# Patient Record
Sex: Female | Born: 1980 | Race: White | Hispanic: No | Marital: Single | Smoking: Current every day smoker
Health system: Southern US, Community
[De-identification: ages and names within clinical notes are randomized; demographics above are authoritative.]

## PROBLEM LIST (undated history)

## (undated) DIAGNOSIS — I4891 Unspecified atrial fibrillation: Secondary | ICD-10-CM

## (undated) DIAGNOSIS — R011 Cardiac murmur, unspecified: Secondary | ICD-10-CM

## (undated) HISTORY — PX: TONSILLECTOMY: SUR1361

---

## 2014-08-22 ENCOUNTER — Emergency Department (HOSPITAL_COMMUNITY)
Admission: EM | Admit: 2014-08-22 | Discharge: 2014-08-23 | Disposition: A | Discharge status: Home / Self Care | Payer: Self-pay | Attending: Emergency Medicine | Admitting: Emergency Medicine

## 2014-08-22 ENCOUNTER — Emergency Department (HOSPITAL_COMMUNITY): Payer: Self-pay

## 2014-08-22 ENCOUNTER — Encounter (HOSPITAL_COMMUNITY): Payer: Self-pay | Admitting: Emergency Medicine

## 2014-08-22 DIAGNOSIS — F1111 Opioid abuse, in remission: Secondary | ICD-10-CM | POA: Diagnosis present

## 2014-08-22 DIAGNOSIS — F411 Generalized anxiety disorder: Secondary | ICD-10-CM | POA: Insufficient documentation

## 2014-08-22 DIAGNOSIS — F419 Anxiety disorder, unspecified: Secondary | ICD-10-CM | POA: Diagnosis present

## 2014-08-22 DIAGNOSIS — Z008 Encounter for other general examination: Secondary | ICD-10-CM | POA: Insufficient documentation

## 2014-08-22 DIAGNOSIS — R079 Chest pain, unspecified: Secondary | ICD-10-CM | POA: Insufficient documentation

## 2014-08-22 DIAGNOSIS — F1320 Sedative, hypnotic or anxiolytic dependence, uncomplicated: Secondary | ICD-10-CM

## 2014-08-22 DIAGNOSIS — F13230 Sedative, hypnotic or anxiolytic dependence with withdrawal, uncomplicated: Secondary | ICD-10-CM | POA: Diagnosis present

## 2014-08-22 DIAGNOSIS — F13930 Sedative, hypnotic or anxiolytic use, unspecified with withdrawal, uncomplicated: Secondary | ICD-10-CM | POA: Diagnosis present

## 2014-08-22 DIAGNOSIS — R002 Palpitations: Secondary | ICD-10-CM | POA: Insufficient documentation

## 2014-08-22 DIAGNOSIS — F4310 Post-traumatic stress disorder, unspecified: Secondary | ICD-10-CM | POA: Insufficient documentation

## 2014-08-22 DIAGNOSIS — F17200 Nicotine dependence, unspecified, uncomplicated: Secondary | ICD-10-CM | POA: Insufficient documentation

## 2014-08-22 DIAGNOSIS — F329 Major depressive disorder, single episode, unspecified: Secondary | ICD-10-CM | POA: Insufficient documentation

## 2014-08-22 DIAGNOSIS — Z91040 Latex allergy status: Secondary | ICD-10-CM | POA: Insufficient documentation

## 2014-08-22 DIAGNOSIS — F1120 Opioid dependence, uncomplicated: Secondary | ICD-10-CM | POA: Diagnosis present

## 2014-08-22 DIAGNOSIS — R45851 Suicidal ideations: Secondary | ICD-10-CM

## 2014-08-22 DIAGNOSIS — Z3202 Encounter for pregnancy test, result negative: Secondary | ICD-10-CM | POA: Insufficient documentation

## 2014-08-22 DIAGNOSIS — M7989 Other specified soft tissue disorders: Secondary | ICD-10-CM | POA: Insufficient documentation

## 2014-08-22 DIAGNOSIS — Z8679 Personal history of other diseases of the circulatory system: Secondary | ICD-10-CM | POA: Insufficient documentation

## 2014-08-22 DIAGNOSIS — R110 Nausea: Secondary | ICD-10-CM | POA: Insufficient documentation

## 2014-08-22 DIAGNOSIS — R011 Cardiac murmur, unspecified: Secondary | ICD-10-CM | POA: Insufficient documentation

## 2014-08-22 DIAGNOSIS — F1994 Other psychoactive substance use, unspecified with psychoactive substance-induced mood disorder: Secondary | ICD-10-CM | POA: Diagnosis present

## 2014-08-22 DIAGNOSIS — F19939 Other psychoactive substance use, unspecified with withdrawal, unspecified: Secondary | ICD-10-CM | POA: Insufficient documentation

## 2014-08-22 HISTORY — DX: Cardiac murmur, unspecified: R01.1

## 2014-08-22 HISTORY — DX: Unspecified atrial fibrillation: I48.91

## 2014-08-22 LAB — RAPID URINE DRUG SCREEN, HOSP PERFORMED
Amphetamines: NOT DETECTED
BENZODIAZEPINES: POSITIVE — AB
Barbiturates: NOT DETECTED
Cocaine: NOT DETECTED
Opiates: NOT DETECTED
Tetrahydrocannabinol: NOT DETECTED

## 2014-08-22 LAB — CBC
HEMATOCRIT: 39.3 % (ref 36.0–46.0)
Hemoglobin: 13.2 g/dL (ref 12.0–15.0)
MCH: 29.5 pg (ref 26.0–34.0)
MCHC: 33.6 g/dL (ref 30.0–36.0)
MCV: 87.7 fL (ref 78.0–100.0)
PLATELETS: 240 10*3/uL (ref 150–400)
RBC: 4.48 MIL/uL (ref 3.87–5.11)
RDW: 13.3 % (ref 11.5–15.5)
WBC: 8.5 10*3/uL (ref 4.0–10.5)

## 2014-08-22 LAB — URINALYSIS, ROUTINE W REFLEX MICROSCOPIC
Bilirubin Urine: NEGATIVE
GLUCOSE, UA: NEGATIVE mg/dL
Hgb urine dipstick: NEGATIVE
KETONES UR: NEGATIVE mg/dL
LEUKOCYTES UA: NEGATIVE
Nitrite: NEGATIVE
PH: 8 (ref 5.0–8.0)
Protein, ur: NEGATIVE mg/dL
Specific Gravity, Urine: 1.014 (ref 1.005–1.030)
Urobilinogen, UA: 0.2 mg/dL (ref 0.0–1.0)

## 2014-08-22 LAB — ACETAMINOPHEN LEVEL: Acetaminophen (Tylenol), Serum: <15 ug/mL (ref 10–30)

## 2014-08-22 LAB — COMPREHENSIVE METABOLIC PANEL
ALBUMIN: 4 g/dL (ref 3.5–5.2)
ALT: 10 U/L (ref 0–35)
AST: 13 U/L (ref 0–37)
Alkaline Phosphatase: 63 U/L (ref 39–117)
Anion gap: 13 (ref 5–15)
BUN: 8 mg/dL (ref 6–23)
CALCIUM: 9.7 mg/dL (ref 8.4–10.5)
CO2: 23 mEq/L (ref 19–32)
CREATININE: 0.84 mg/dL (ref 0.50–1.10)
Chloride: 103 mEq/L (ref 96–112)
GFR calc Af Amer: >90 mL/min (ref >90)
GFR calc non Af Amer: >90 mL/min (ref >90)
Glucose, Bld: 85 mg/dL (ref 70–99)
Potassium: 4.6 mEq/L (ref 3.7–5.3)
Sodium: 139 mEq/L (ref 137–147)
Total Bilirubin: 0.2 mg/dL — ABNORMAL LOW (ref 0.3–1.2)
Total Protein: 7.7 g/dL (ref 6.0–8.3)

## 2014-08-22 LAB — D-DIMER, QUANTITATIVE (NOT AT ARMC)

## 2014-08-22 LAB — PREGNANCY, URINE: PREG TEST UR: NEGATIVE

## 2014-08-22 LAB — ETHANOL

## 2014-08-22 LAB — SALICYLATE LEVEL

## 2014-08-22 MED ORDER — ONDANSETRON HCL 4 MG PO TABS: 4.0000 mg | ORAL_TABLET | Freq: Three times a day (TID) | ORAL | Status: DC | PRN

## 2014-08-22 MED ORDER — IBUPROFEN 200 MG PO TABS
600.0000 mg | ORAL_TABLET | Freq: Three times a day (TID) | ORAL | Status: DC | PRN
  Administered 2014-08-22: 600 mg via ORAL
  Filled 2014-08-22: qty 3

## 2014-08-22 MED ORDER — MISOPROSTOL 100 MCG PO TABS
100.0000 ug | ORAL_TABLET | Freq: Three times a day (TID) | ORAL | Status: DC
  Filled 2014-08-22 (×4): qty 1

## 2014-08-22 MED ORDER — QUETIAPINE FUMARATE 100 MG PO TABS
150.0000 mg | ORAL_TABLET | Freq: Every day | ORAL | Status: DC
  Administered 2014-08-22: 150 mg via ORAL
  Filled 2014-08-22 (×2): qty 1

## 2014-08-22 MED ORDER — LORAZEPAM 1 MG PO TABS
1.0000 mg | ORAL_TABLET | Freq: Once | ORAL | Status: AC
  Administered 2014-08-22: 1 mg via ORAL
  Filled 2014-08-22: qty 1

## 2014-08-22 MED ORDER — ONDANSETRON 4 MG PO TBDP
4.0000 mg | ORAL_TABLET | Freq: Once | ORAL | Status: DC
  Filled 2014-08-22: qty 1

## 2014-08-22 MED ORDER — NICOTINE 21 MG/24HR TD PT24
21.0000 mg | MEDICATED_PATCH | Freq: Every day | TRANSDERMAL | Status: DC
  Administered 2014-08-22 – 2014-08-23 (×2): 21 mg via TRANSDERMAL
  Filled 2014-08-22 (×2): qty 1

## 2014-08-22 MED ORDER — ZOLPIDEM TARTRATE 5 MG PO TABS
5.0000 mg | ORAL_TABLET | Freq: Every evening | ORAL | Status: DC | PRN
  Filled 2014-08-22: qty 1

## 2014-08-22 MED ORDER — PANTOPRAZOLE SODIUM 40 MG PO TBEC
40.0000 mg | DELAYED_RELEASE_TABLET | Freq: Two times a day (BID) | ORAL | Status: DC
  Administered 2014-08-22 – 2014-08-23 (×2): 40 mg via ORAL
  Filled 2014-08-22 (×2): qty 1

## 2014-08-22 MED ORDER — QUETIAPINE FUMARATE 50 MG PO TABS
50.0000 mg | ORAL_TABLET | Freq: Three times a day (TID) | ORAL | Status: DC
  Administered 2014-08-22 – 2014-08-23 (×2): 50 mg via ORAL
  Filled 2014-08-22: qty 2
  Filled 2014-08-22: qty 1

## 2014-08-22 MED ORDER — LORAZEPAM 1 MG PO TABS: 1.0000 mg | ORAL_TABLET | Freq: Three times a day (TID) | ORAL | Status: DC | PRN

## 2014-08-22 MED ORDER — ACETAMINOPHEN 325 MG PO TABS: 650.0000 mg | ORAL_TABLET | ORAL | Status: DC | PRN

## 2014-08-22 MED ORDER — CARBAMAZEPINE 200 MG PO TABS
200.0000 mg | ORAL_TABLET | Freq: Two times a day (BID) | ORAL | Status: DC
  Administered 2014-08-22 – 2014-08-23 (×2): 200 mg via ORAL
  Filled 2014-08-22 (×4): qty 1

## 2014-08-22 NOTE — ED Notes (Signed)
Patient has one belonging bag at triage nurse's station. Has 2cell phones,charger and $70.50 locked up with security.

## 2014-08-22 NOTE — ED Notes (Signed)
On the phone 

## 2014-08-22 NOTE — ED Notes (Signed)
Pt. C/o breast discomfort related to breast augmentation surgery. Pts. Bra obtained from locker and given to patient.
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
## 2014-08-22 NOTE — ED Notes (Signed)
I stuck patient twice and was unsuccessful made nurse aware
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
## 2014-08-22 NOTE — ED Notes (Signed)
Jamison NP into see 
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
## 2014-08-22 NOTE — ED Notes (Signed)
Report received from [facility]. Pt. Alert and oriented in no distress denies SI, HI, AVH. Pt. C/o some chronic back pain. Will continue to monitor for safety. Pt. Instructed to come to me with problems or concerns. Q 15 minute checks continue.

## 2014-08-23 ENCOUNTER — Encounter (HOSPITAL_COMMUNITY): Payer: Self-pay | Admitting: Registered Nurse

## 2014-08-23 DIAGNOSIS — R45851 Suicidal ideations: Secondary | ICD-10-CM

## 2014-08-23 DIAGNOSIS — F1111 Opioid abuse, in remission: Secondary | ICD-10-CM | POA: Diagnosis present

## 2014-08-23 MED ORDER — QUETIAPINE FUMARATE 25 MG PO TABS
25.0000 mg | ORAL_TABLET | Freq: Three times a day (TID) | ORAL | Status: DC
  Administered 2014-08-23: 25 mg via ORAL
  Filled 2014-08-23: qty 1

## 2014-08-23 MED ORDER — CARBAMAZEPINE ER 100 MG PO TB12
100.0000 mg | ORAL_TABLET | Freq: Two times a day (BID) | ORAL | Status: DC
  Filled 2014-08-23: qty 1

## 2014-08-23 MED ORDER — MISOPROSTOL 100 MCG PO TABS
100.0000 ug | ORAL_TABLET | Freq: Three times a day (TID) | ORAL | Status: DC
  Filled 2014-08-23 (×2): qty 1

## 2014-08-23 MED ORDER — CARBAMAZEPINE ER 100 MG PO TB12: 100.0000 mg | ORAL_TABLET | Freq: Every day | ORAL | Status: DC

## 2014-08-23 NOTE — ED Notes (Signed)
Pharmacy tech into see 

## 2014-08-23 NOTE — Consult Note (Signed)
[facility] Face-to-Face Psychiatry Consult   Reason for Consult:  Benzodiazepine/heroin dependency Referring Physician:  EDP  Angie Allison is an 33 y.o. female. Total Time spent with patient: 20 minutes  Assessment: AXIS I:  PTSD, anxiety, major depression, benzodiazepine and heroin dependency AXIS II:  Deferred AXIS III:   Past Medical History  Diagnosis Date  . A-fib   . Heart murmur    AXIS IV:  other psychosocial or environmental problems, problems related to social environment and problems with primary support group AXIS V:  21-30 behavior considerably influenced by delusions or hallucinations OR serious impairment in judgment, communication OR inability to function in almost all areas  Plan:  Recommend psychiatric Inpatient admission when medically cleared.  Subjective:   Angie Allison is a 33 y.o. female patient admitted with benzodiazepine detox.  HPI: Patient states that she has spoke to her grandmother and that another program has been found and that they can send a cousin to pick her up.  Patient denies suicidal/homicidal ideation, psychosis, and paranoia.  SW spoke with patient's grandmother and confirmed patient statements.    HPI Elements:   Location:  generalized. Quality:  acute. Severity:  severe. Timing:  constant. Duration:  1 years. Context:  stressors.  Past Psychiatric History: Past Medical History  Diagnosis Date  . A-fib   . Heart murmur     reports that she has been smoking Cigarettes.  She has a 1 pack-year smoking history. She has never used smokeless tobacco. She reports that she drinks alcohol. Her drug history is not on file. No family history on file.         Allergies:   Allergies  Allergen Reactions  . Bactrim [Sulfamethoxazole-Tmp Ds]     Cant remember  . Prednisone     Lips swell   . Latex Rash    ACT Assessment Complete:  Yes:    Educational Status    Risk to Self: Risk to self with the past 6 months Is patient at risk for  suicide?: Yes Substance abuse history and/or treatment for substance abuse?: Yes  Risk to Others:    Abuse:    Prior Inpatient Therapy:    Prior Outpatient Therapy:    Additional Information:        Objective: Blood pressure 123/107, pulse 91, temperature 98.3 F (36.8 C), temperature source Oral, resp. rate 18, last menstrual period 08/20/2014, SpO2 100.00%.There is no height or weight on file to calculate BMI. Results for orders placed during the hospital encounter of 08/22/14 (from the past 72 hour(s))  CBC     Status: None   Collection Time    08/22/14  2:44 PM      Result Value Ref Range   WBC 8.5  4.0 - 10.5 K/uL   RBC 4.48  3.87 - 5.11 MIL/uL   Hemoglobin 13.2  12.0 - 15.0 g/dL   HCT 39.3  36.0 - 46.0 %   MCV 87.7  78.0 - 100.0 fL   MCH 29.5  26.0 - 34.0 pg   MCHC 33.6  30.0 - 36.0 g/dL   RDW 13.3  11.5 - 15.5 %   Platelets 240  150 - 400 K/uL  COMPREHENSIVE METABOLIC PANEL     Status: Abnormal   Collection Time    08/22/14  2:44 PM      Result Value Ref Range   Sodium 139  137 - 147 mEq/L   Potassium 4.6  3.7 - 5.3 mEq/L   Chloride 103  96 -  112 mEq/L   CO2 23  19 - 32 mEq/L   Glucose, Bld 85  70 - 99 mg/dL   BUN 8  6 - 23 mg/dL   Creatinine, Ser 0.84  0.50 - 1.10 mg/dL   Calcium 9.7  8.4 - 10.5 mg/dL   Total Protein 7.7  6.0 - 8.3 g/dL   Albumin 4.0  3.5 - 5.2 g/dL   AST 13  0 - 37 U/L   ALT 10  0 - 35 U/L   Alkaline Phosphatase 63  39 - 117 U/L   Total Bilirubin 0.2 (*) 0.3 - 1.2 mg/dL   GFR calc non Af Amer >90  >90 mL/min   GFR calc Af Amer >90  >90 mL/min   Comment: (NOTE)     The eGFR has been calculated using the CKD EPI equation.     This calculation has not been validated in all clinical situations.     eGFR's persistently <90 mL/min signify possible Chronic Kidney     Disease.   Anion gap 13  5 - 15  ETHANOL     Status: None   Collection Time    08/22/14  2:44 PM      Result Value Ref Range   Alcohol, Ethyl (B) <11  0 - 11 mg/dL   Comment:             LOWEST DETECTABLE LIMIT FOR     SERUM ALCOHOL IS 11 mg/dL     FOR MEDICAL PURPOSES ONLY  ACETAMINOPHEN LEVEL     Status: None   Collection Time    08/22/14  2:44 PM      Result Value Ref Range   Acetaminophen (Tylenol), Serum <15.0  10 - 30 ug/mL   Comment:            THERAPEUTIC CONCENTRATIONS VARY     SIGNIFICANTLY. A RANGE OF 10-30     ug/mL MAY BE AN EFFECTIVE     CONCENTRATION FOR MANY PATIENTS.     HOWEVER, SOME ARE BEST TREATED     AT CONCENTRATIONS OUTSIDE THIS     RANGE.     ACETAMINOPHEN CONCENTRATIONS     >150 ug/mL AT 4 HOURS AFTER     INGESTION AND >50 ug/mL AT 12     HOURS AFTER INGESTION ARE     OFTEN ASSOCIATED WITH TOXIC     REACTIONS.  SALICYLATE LEVEL     Status: Abnormal   Collection Time    08/22/14  2:44 PM      Result Value Ref Range   Salicylate Lvl <3.7 (*) 2.8 - 20.0 mg/dL  URINALYSIS, ROUTINE W REFLEX MICROSCOPIC     Status: Abnormal   Collection Time    08/22/14  3:07 PM      Result Value Ref Range   Color, Urine YELLOW  YELLOW   APPearance CLOUDY (*) CLEAR   Specific Gravity, Urine 1.014  1.005 - 1.030   pH 8.0  5.0 - 8.0   Glucose, UA NEGATIVE  NEGATIVE mg/dL   Hgb urine dipstick NEGATIVE  NEGATIVE   Bilirubin Urine NEGATIVE  NEGATIVE   Ketones, ur NEGATIVE  NEGATIVE mg/dL   Protein, ur NEGATIVE  NEGATIVE mg/dL   Urobilinogen, UA 0.2  0.0 - 1.0 mg/dL   Nitrite NEGATIVE  NEGATIVE   Leukocytes, UA NEGATIVE  NEGATIVE   Comment: MICROSCOPIC NOT DONE ON URINES WITH NEGATIVE PROTEIN, BLOOD, LEUKOCYTES, NITRITE, OR GLUCOSE <1000 mg/dL.  PREGNANCY, URINE  Status: None   Collection Time    08/22/14  3:07 PM      Result Value Ref Range   Preg Test, Ur NEGATIVE  NEGATIVE   Comment:            THE SENSITIVITY OF THIS     METHODOLOGY IS >20 mIU/mL.  URINE RAPID DRUG SCREEN (HOSP PERFORMED)     Status: Abnormal   Collection Time    08/22/14  3:08 PM      Result Value Ref Range   Opiates NONE DETECTED  NONE DETECTED   Cocaine NONE  DETECTED  NONE DETECTED   Benzodiazepines POSITIVE (*) NONE DETECTED   Amphetamines NONE DETECTED  NONE DETECTED   Tetrahydrocannabinol NONE DETECTED  NONE DETECTED   Barbiturates NONE DETECTED  NONE DETECTED   Comment:            DRUG SCREEN FOR MEDICAL PURPOSES     ONLY.  IF CONFIRMATION IS NEEDED     FOR ANY PURPOSE, NOTIFY LAB     WITHIN 5 DAYS.                LOWEST DETECTABLE LIMITS     FOR URINE DRUG SCREEN     Drug Class       Cutoff (ng/mL)     Amphetamine      1000     Barbiturate      200     Benzodiazepine   622     Tricyclics       297     Opiates          300     Cocaine          300     THC              50  D-DIMER, QUANTITATIVE     Status: None   Collection Time    08/22/14  3:10 PM      Result Value Ref Range   D-Dimer, Quant <0.27  0.00 - 0.48 ug/mL-FEU   Comment:            AT THE INHOUSE ESTABLISHED CUTOFF     VALUE OF 0.48 ug/mL FEU,     THIS ASSAY HAS BEEN DOCUMENTED     IN THE LITERATURE TO HAVE     A SENSITIVITY AND NEGATIVE     PREDICTIVE VALUE OF AT LEAST     98 TO 99%.  THE TEST RESULT     SHOULD BE CORRELATED WITH     AN ASSESSMENT OF THE CLINICAL     PROBABILITY OF DVT / VTE.   Labs are reviewed and are pertinent for no medical issues noted.  Current Facility-Administered Medications  Medication Dose Route Frequency Provider Last Rate Last Dose  . acetaminophen (TYLENOL) tablet 650 mg  650 mg Oral Q4H PRN Stephani Police Piepenbrink, PA-C      . [START ON 08/24/2014] carbamazepine (TEGRETOL XR) 12 hr tablet 100 mg  100 mg Oral Daily Shuvon Rankin, NP      . carbamazepine (TEGRETOL XR) 12 hr tablet 100 mg  100 mg Oral BID Shuvon Rankin, NP      . ibuprofen (ADVIL,MOTRIN) tablet 600 mg  600 mg Oral Q8H PRN Jennifer L Piepenbrink, PA-C   600 mg at 08/22/14 2018  . misoprostol (CYTOTEC) tablet 100 mcg  100 mcg Oral TID PC Shuvon Rankin, NP      . nicotine (NICODERM CQ - dosed in mg/24  hours) patch 21 mg  21 mg Transdermal Daily Jennifer L Piepenbrink,  PA-C   21 mg at 08/23/14 1003  . ondansetron (ZOFRAN) tablet 4 mg  4 mg Oral Q8H PRN Jennifer L Piepenbrink, PA-C      . ondansetron (ZOFRAN-ODT) disintegrating tablet 4 mg  4 mg Oral Once Jennifer L Piepenbrink, PA-C      . pantoprazole (PROTONIX) EC tablet 40 mg  40 mg Oral BID Waylan Boga, NP   40 mg at 08/23/14 1002  . QUEtiapine (SEROQUEL) tablet 150 mg  150 mg Oral QHS Waylan Boga, NP   150 mg at 08/22/14 2135  . QUEtiapine (SEROQUEL) tablet 25 mg  25 mg Oral TID Shuvon Rankin, NP   25 mg at 08/23/14 1424  . zolpidem (AMBIEN) tablet 5 mg  5 mg Oral QHS PRN Waylan Boga, NP       Current Outpatient Prescriptions  Medication Sig Dispense Refill  . acetaminophen (TYLENOL) 500 MG tablet Take 500 mg by mouth every 6 (six) hours as needed for moderate pain.      Marland Kitchen ALPRAZolam (XANAX) 1 MG tablet Take 1 mg by mouth 3 (three) times daily as needed for anxiety.      Marland Kitchen alum & mag hydroxide-simeth (MAALOX/MYLANTA) 200-200-20 MG/5ML suspension Take 30 mLs by mouth every 6 (six) hours as needed for indigestion or heartburn.      . buprenorphine (SUBUTEX) 2 MG SUBL SL tablet Place 2 mg under the tongue daily.      . carbamazepine (TEGRETOL XR) 100 MG 12 hr tablet Take 100 mg by mouth 2 (two) times daily. Take for three days then decrease to once daily for three days      . cloNIDine (CATAPRES - DOSED IN MG/24 HR) 0.1 mg/24hr patch Place 0.1 mg onto the skin daily.      Marland Kitchen dicyclomine (BENTYL) 20 MG tablet Take 20 mg by mouth 4 (four) times daily as needed (stomac cramps).      Marland Kitchen esomeprazole (NEXIUM) 40 MG capsule Take 40 mg by mouth daily at 12 noon.      Marland Kitchen ibuprofen (ADVIL,MOTRIN) 600 MG tablet Take 600 mg by mouth every 6 (six) hours as needed for moderate pain.      . methocarbamol (ROBAXIN) 500 MG tablet Take 1,000 mg by mouth every 6 (six) hours as needed for muscle spasms.      . misoprostol (CYTOTEC) 100 MCG tablet Take 100 mcg by mouth 3 (three) times daily.      . Multiple Vitamin  (MULTIVITAMIN WITH MINERALS) TABS tablet Take 1 tablet by mouth daily.      . nicotine (NICODERM CQ - DOSED IN MG/24 HOURS) 21 mg/24hr patch Place 21 mg onto the skin daily.      . QUEtiapine (SEROQUEL) 25 MG tablet Take 25-50 mg by mouth 3 (three) times daily. 43m at 9am 224m1pm and 50 at 9pm      . thiamine 100 MG tablet Take 100 mg by mouth daily.        Psychiatric Specialty Exam:     Blood pressure 123/107, pulse 91, temperature 98.3 F (36.8 C), temperature source Oral, resp. rate 18, last menstrual period 08/20/2014, SpO2 100.00%.There is no height or weight on file to calculate BMI.  General Appearance: Casual  Eye Contact::  Fair  Speech:  Normal Rate  Volume:  Normal  Mood:  Anxious and Depressed  Affect:  Congruent  Thought Process:  Coherent  Orientation:  Full (Time, Place,  and Person)  Thought Content:  WDL  Suicidal Thoughts:  No  Homicidal Thoughts:  No  Memory:  Immediate;   Good Recent;   Good Remote;   Good  Judgement:  Intact  Insight:  Fair and Present  Psychomotor Activity:  Normal  Concentration:  Fair  Recall:  Good  Fund of Knowledge:Good  Language: Good  Akathisia:  No  Handed:  Right  AIMS (if indicated):     Assets:  Communication Skills Desire for Improvement Financial Resources/Insurance Housing Leisure Time Physical Health Resilience  Sleep:      Musculoskeletal: Strength & Muscle Tone: within normal limits Gait & Station: normal Patient leans: N/A  Treatment Plan Summary: Discharge home  Give resources for rehab/inpatient/outpatient substance abuse  Earleen Newport, FNP-BC  08/23/2014 3:13 PM  Patient is in face-to-face for psychiatric evaluation, case discussed with the treatment team and a physician extender and formulated treatment plan.Reviewed the information documented and agree with the treatment plan.  Ramatoulaye Pack,JANARDHAHA R. 08/23/2014 5:47 PM

## 2014-08-23 NOTE — BH Assessment (Signed)
Binnie Rail, [facility], confirmed adult unit is at capacity. Contacted the following facilities for placement:   BED AVAILABLE, FAXED CLINICAL INFORMATION:  [facility], per Misty Stanley   AT CAPACITY:  [facility], per [facility], per [facility], per [facility], per [facility], per [facility], per [facility], per [facility], per [facility], per [facility], per [facility], per [place] [facility], per [facility], per [facility], per [facility]  [facility], per [facility], per [facility], per [facility]   MULTIPLE ATTEMPTS TO CONTACT WITH NO RESPONSE:  [facility]  [facility]   PT DECLINED:  [facility] [address] Patsy [place], [facility]  Triage Specialist  [phone]

## 2014-08-23 NOTE — Progress Notes (Signed)
11:49am. Per request of Psych NP, CSW called pt's grandmother, Olene Craven (consent on chart) to confirm safe d/c plan.   Per Isabelle Course [phone]), cousin Clearance Coots is en route to pick up pt from hospital. He is about six hours away in [place]. He will  bring pt immediately back to [place] to stay with grandmother Isabelle Course until Monday or Tuesday when pt can have a bed at inpatient facility [facility] in [place]. [facility] is an inpatient drug/alcohol facility specifically for dual diagnosis patients.  Mariann Laster,     ED CSW  phone: [phone]

## 2014-08-23 NOTE — [facility] Suicide Risk Assessment (Cosign Needed)
Suicide Risk Assessment  Discharge Assessment     Demographic Factors:  Caucasian  Total Time spent with patient: 30 minutes  Psychiatric Specialty Exam:     Blood pressure 123/107, pulse 91, temperature 98.3 F (36.8 C), temperature source Oral, resp. rate 18, last menstrual period 08/20/2014, SpO2 100.00%.There is no height or weight on file to calculate BMI.   General Appearance: Casual   Eye Contact:: Fair   Speech: Normal Rate   Volume: Normal   Mood: Anxious and Depressed   Affect: Congruent   Thought Process: Coherent   Orientation: Full (Time, Place, and Person)   Thought Content: WDL   Suicidal Thoughts: No   Homicidal Thoughts: No   Memory: Immediate; Good  Recent; Good  Remote; Good   Judgement: Intact   Insight: Fair and Present   Psychomotor Activity: Normal   Concentration: Fair   Recall: Good   Fund of Knowledge:Good   Language: Good   Akathisia: No   Handed: Right   AIMS (if indicated):   Assets: Communication Skills  Desire for Improvement  Financial Resources/Insurance  Housing  Leisure Time  Physical Health  Resilience   Sleep:   Musculoskeletal:  Strength & Muscle Tone: within normal limits  Gait & Station: normal  Patient leans: N/A   Mental Status Per Nursing Assessment::   On Admission:     Current Mental Status by Physician: Patient denies suicidal/homicidal ideation, psychosis, and paranoia  Loss Factors: NA  Historical Factors: NA  Risk Reduction Factors:   Sense of responsibility to family, Living with another person, especially a relative and Positive social support  Continued Clinical Symptoms:  Alcohol/Substance Abuse/Dependencies  Cognitive Features That Contribute To Risk:  None noted    Suicide Risk:  Minimal: No identifiable suicidal ideation.  Patients presenting with no risk factors but with morbid ruminations; may be classified as minimal risk based on the severity of the depressive symptoms  Discharge  Diagnoses: AXIS I: PTSD, anxiety, major depression, benzodiazepine and heroin dependency  AXIS II: Deferred  AXIS III:  Past Medical History   Diagnosis  Date   .  A-fib    .  Heart murmur     AXIS IV: other psychosocial or environmental problems, problems related to social environment and problems with primary support group  AXIS V: 21-30 behavior considerably influenced by delusions or hallucinations OR serious impairment in judgment, communication OR inability to function in almost all areas  Plan Of Care/Follow-up recommendations:  Activity:  as tolerated Diet:  as tolerated  Is patient on multiple antipsychotic therapies at discharge:  No   Has Patient had three or more failed trials of antipsychotic monotherapy by history:  No  Recommended Plan for Multiple Antipsychotic Therapies: NA    Rankin, Shuvon, FNP-BC 08/23/2014, 3:27 PM

## 2014-08-23 NOTE — ED Notes (Signed)
Waiting for pharmacy to verify meds, pharmacy contacted and is int he process of finalizing it.

## 2014-08-23 NOTE — Discharge Instructions (Signed)
Benzodiazepine Withdrawal  °Benzodiazepines are a group of drugs that are prescribed for both short-term and long-term treatment of a variety of medical conditions. For some of these conditions, such as seizures and sudden and severe muscle spasms, they are used only for a few hours or a few days. For other conditions, such as anxiety, sleep problems, or frequent muscle spasms or to help prevent seizures, they are used for an extended period, usually weeks or months. °Benzodiazepines work by changing the way your brain functions. Normally, chemicals in your brain called neurotransmitters send messages between your brain cells. The neurotransmitter that benzodiazepines affect is called gamma-aminobutyric acid (GABA). GABA sends out messages that have a calming effect on many of the functions of your brain. Benzodiazepines make these messages stronger and increase this calming effect. °Short-term use of benzodiazepines usually does not cause problems when you stop taking the drugs. However, if you take benzodiazepines for a long time, your body can adjust to the drug and require more of it to produce the same effect (drug tolerance). Eventually, you can develop physical dependence on benzodiazepines, which is when you experience negative effects if your dosage of benzodiazepines is reduced or stopped too quickly. These negative effects are called symptoms of withdrawal. °SYMPTOMS °Symptoms of withdrawal may begin anytime within the first 10 days after you stop taking the benzodiazepine. They can last from several weeks up to a few months but usually are the worst between the first 10 to 14 days.  °The actual symptoms also vary, depending on the type of benzodiazepine you take. Possible symptoms include: °· Anxiety. °· Excitability. °· Irritability. °· Depression. °· Mood swings. °· Trouble sleeping. °· Confusion. °· Uncontrollable shaking (tremors). °· Muscle weakness. °· Seizures. °DIAGNOSIS °To diagnose  benzodiazepine withdrawal, your caregiver will examine you for certain signs, such as: °· Rapid heartbeat. °· Rapid breathing. °· Tremors. °· High blood pressure. °· Fever. °· Mood changes. °Your caregiver also may ask the following questions about your use of benzodiazepines: °· What type of benzodiazepine did you take? °· How much did you take each day? °· How long did you take the drug? °· When was the last time you took the drug? °· Do you take any other drugs? °· Have you had alcohol recently? °· Have you had a seizure recently? °· Have you lost consciousness recently? °· Have you had trouble remembering recent events? °· Have you had a recent increase in anxiety, irritability, or trouble sleeping? °A drug test also may be administered. °TREATMENT °The treatment for benzodiazepine withdrawal can vary, depending on the type and severity of your symptoms, what type of benzodiazepine you have been taking, and how long you have been taking the benzodiazepine. Sometimes it is necessary for you to be treated in a hospital, especially if you are at risk of seizures.  °Often, treatment includes a prescription for a long-acting benzodiazepine, the dosage of which is reduced slowly over a long period. This period could be several weeks or months. Eventually, your dosage will be reduced to a point that you can stop taking the drug, without experiencing withdrawal symptoms. This is called tapered withdrawal. Occasionally, minor symptoms of withdrawal continue for a few days or weeks after you have completed a tapered withdrawal. °SEEK IMMEDIATE MEDICAL CARE IF: °· You have a seizure. °· You develop a craving for drugs or alcohol. °· You begin to experience symptoms of withdrawal during your tapered withdrawal. °· You become very confused. °· You lose consciousness. °· You   have trouble breathing.  You think about hurting yourself or someone else. Document Released: 11/16/2011 Document Revised: 02/19/2012 Document  Reviewed: 11/16/2011 [facility] Patient Information 2015 [place], [place]. This information is not intended to replace advice given to you by your health care provider. Make sure you discuss any questions you have with your health care provider.  Depression Depression is feeling sad, low, down in the dumps, blue, gloomy, or empty. In general, there are two kinds of depression:  Normal sadness or grief. This can happen after something upsetting. It often goes away on its own within 2 weeks. After losing a loved one (bereavement), normal sadness and grief may last longer than two weeks. It usually gets better with time.  Clinical depression. This kind lasts longer than normal sadness or grief. It keeps you from doing the things you normally do in life. It is often hard to function at home, work, or at school. It may affect your relationships with others. Treatment is often needed. GET HELP RIGHT AWAY IF:  You have thoughts about hurting yourself or others.  You lose touch with reality (psychotic symptoms). You may:  See or hear things that are not real.  Have untrue beliefs about your life or people around you.  Your medicine is giving you problems. MAKE SURE YOU:  Understand these instructions.  Will watch your condition.  Will get help right away if you are not doing well or get worse. Document Released: 12/30/2010 Document Revised: 04/13/2014 Document Reviewed: 03/28/2012 [facility] Patient Information 2015 [place], [place]. This information is not intended to replace advice given to you by your health care provider. Make sure you discuss any questions you have with your health care provider.  Narcotic Overdose A narcotic overdose is the misuse or overuse of a narcotic drug. A narcotic overdose can make you pass out and stop breathing. If you are not treated right away, this can cause permanent brain damage or stop your heart. Medicine may be given to reverse the effects of an overdose. If  so, this medicine may bring on withdrawal symptoms. The symptoms may be abdominal cramps, throwing up (vomiting), sweating, chills, and nervousness. Injecting narcotics can cause more problems than just an overdose. AIDS, hepatitis, and other very serious infections are transmitted by sharing needles and syringes. If you decide to quit using, there are medicines which can help you through the withdrawal period. Trying to quit all at once on your own can be uncomfortable, but not life-threatening. Call your caregiver, Narcotics Anonymous, or any drug and alcohol treatment program for further help.  Document Released: 01/04/2005 Document Revised: 02/19/2012 Document Reviewed: 10/29/2009 [facility] Patient Information 2015 [place], [place]. This information is not intended to replace advice given to you by your health care provider. Make sure you discuss any questions you have with your health care provider.

## 2014-08-23 NOTE — ED Notes (Signed)
On the phone 
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
## 2014-08-23 NOTE — ED Notes (Signed)
Pt reports that she takes the cytotec orally for her ulcers

## 2014-08-23 NOTE — ED Notes (Signed)
Up on the phone 

## 2014-08-23 NOTE — Progress Notes (Addendum)
Per Joni Reining at [facility] had not received referral, pre-screen along with referral faxed. *pt declined at [facility] d/t assessment stating she needed to be referred to psych inpatient facility.  Have placed several calls to [facility] to f/u on referral but have had no response. Will make attempt to contact again later.   Tomi Bamberger Disposition MHT

## 2014-08-23 NOTE — ED Notes (Signed)
Up in hall, tearful.  Pt reports that her family is trying to arraigned for her to be admitted to  treatment facility and  For her transportation there.  Pt is aware that the Dr. Must release her before she can leave

## 2015-11-06 IMAGING — CR DG CHEST 2V
2 series · 2 of 2 positions shown · non-contrast
Comparison: No priors.

CLINICAL DATA: Shortness breath.  Panic attack.

EXAM:
CHEST  2 VIEW

[w chest pa]
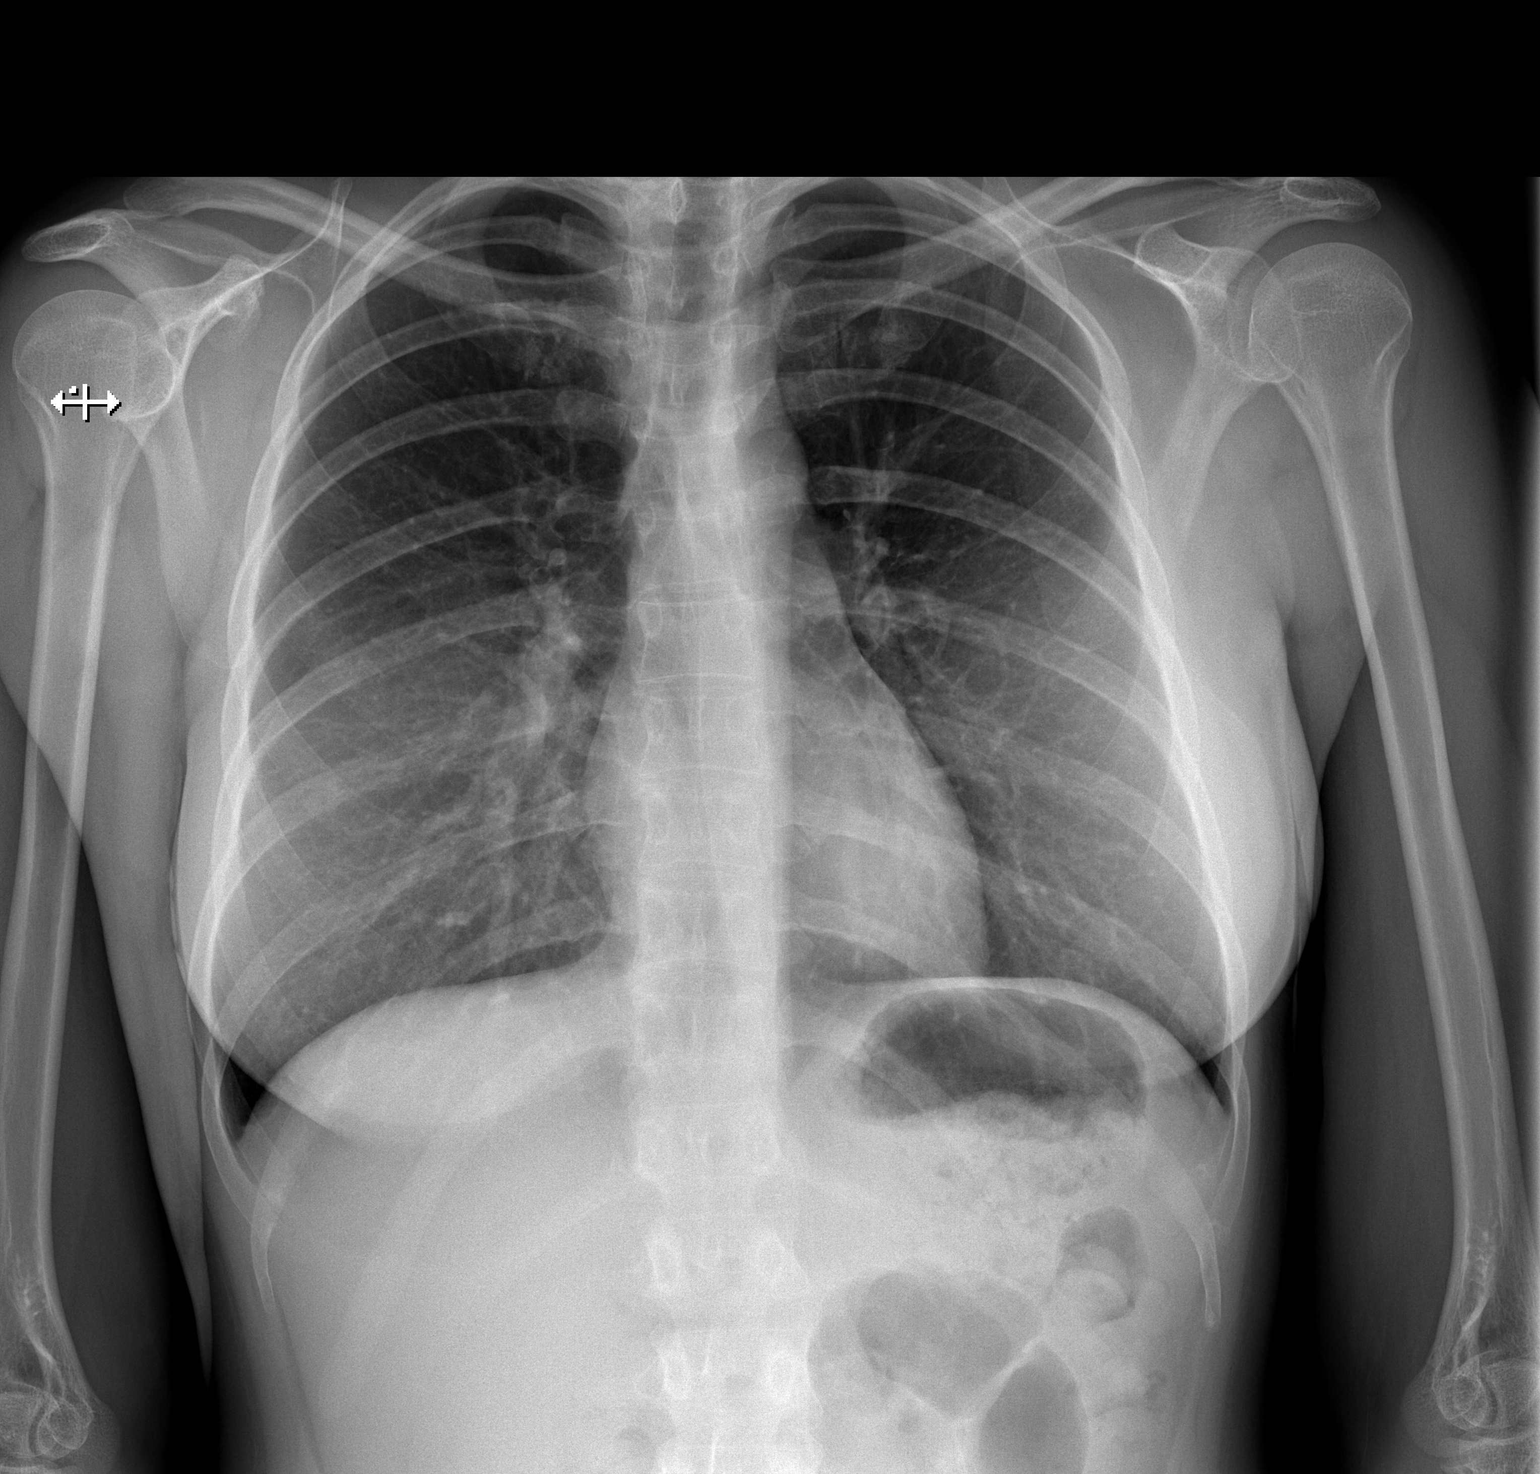

[w chest lat]
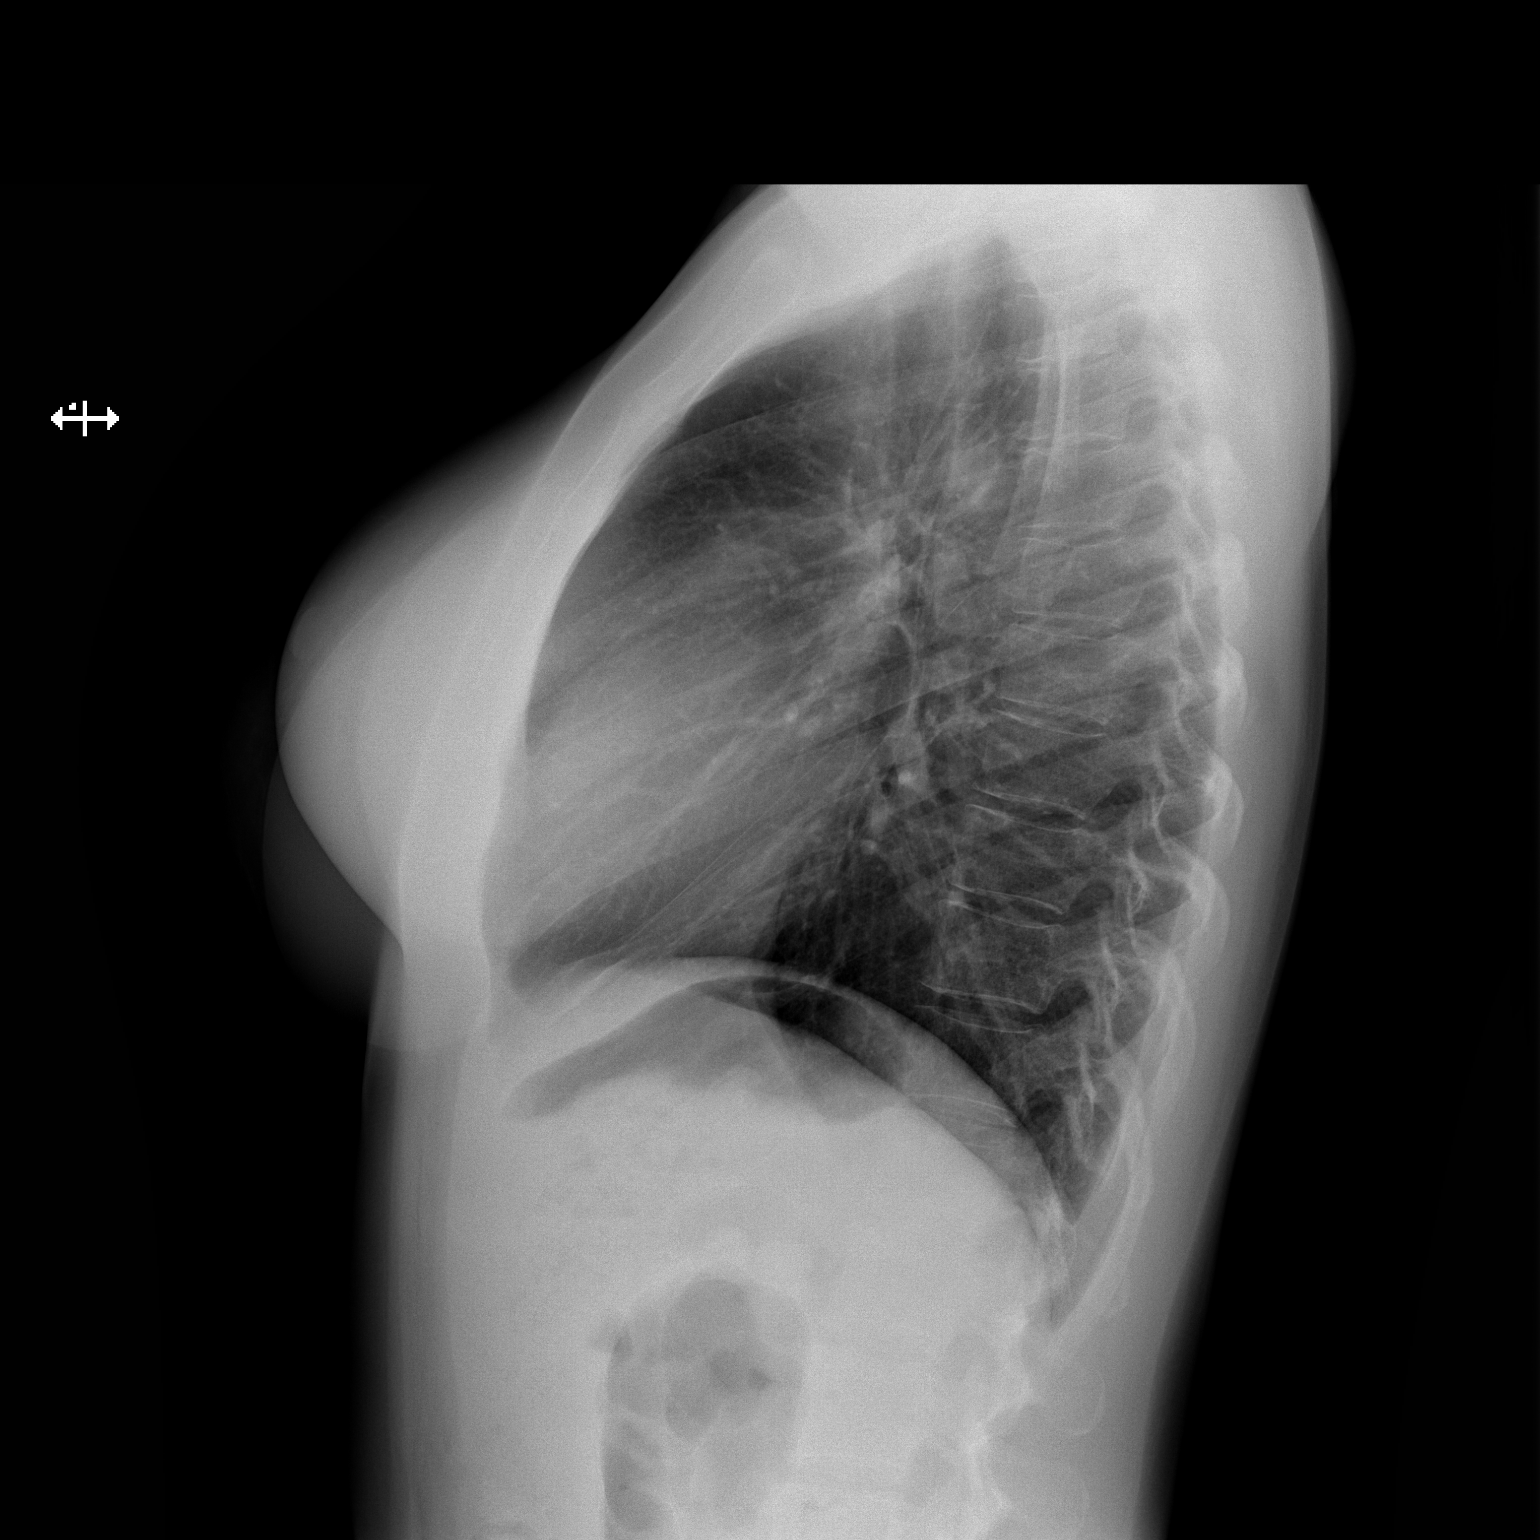

[2 of 2 positions shown; findings below may reference images not displayed]

FINDINGS: Lung volumes are normal. No consolidative airspace disease. No
pleural effusions. No pneumothorax. No pulmonary nodule or mass
noted. Pulmonary vasculature and the cardiomediastinal silhouette
are within normal limits.
IMPRESSION: No radiographic evidence of acute cardiopulmonary disease.
